# Patient Record
Sex: Male | Born: 1975 | Race: Black or African American | Hispanic: No | Marital: Single | Smoking: Current every day smoker
Health system: Southern US, Community
[De-identification: ages and names within clinical notes are randomized; demographics above are authoritative.]

---

## 2005-10-11 ENCOUNTER — Emergency Department (HOSPITAL_COMMUNITY): Admission: EM | Admit: 2005-10-11 | Discharge: 2005-10-11 | Payer: Self-pay | Admitting: Cardiology

## 2007-04-06 ENCOUNTER — Emergency Department (HOSPITAL_COMMUNITY): Admission: EM | Admit: 2007-04-06 | Discharge: 2007-04-06 | Payer: Self-pay | Admitting: Emergency Medicine

## 2016-08-05 ENCOUNTER — Emergency Department (HOSPITAL_COMMUNITY)
Admission: EM | Admit: 2016-08-05 | Discharge: 2016-08-05 | Disposition: A | Discharge status: Home / Self Care | Payer: Self-pay | Attending: Emergency Medicine | Admitting: Emergency Medicine

## 2016-08-05 ENCOUNTER — Encounter (HOSPITAL_COMMUNITY): Payer: Self-pay | Admitting: *Deleted

## 2016-08-05 DIAGNOSIS — T148XXA Other injury of unspecified body region, initial encounter: Secondary | ICD-10-CM

## 2016-08-05 DIAGNOSIS — S46911A Strain of unspecified muscle, fascia and tendon at shoulder and upper arm level, right arm, initial encounter: Secondary | ICD-10-CM | POA: Insufficient documentation

## 2016-08-05 DIAGNOSIS — Y939 Activity, unspecified: Secondary | ICD-10-CM | POA: Insufficient documentation

## 2016-08-05 DIAGNOSIS — X503XXA Overexertion from repetitive movements, initial encounter: Secondary | ICD-10-CM | POA: Insufficient documentation

## 2016-08-05 DIAGNOSIS — Y990 Civilian activity done for income or pay: Secondary | ICD-10-CM | POA: Insufficient documentation

## 2016-08-05 DIAGNOSIS — S29012A Strain of muscle and tendon of back wall of thorax, initial encounter: Secondary | ICD-10-CM | POA: Insufficient documentation

## 2016-08-05 DIAGNOSIS — F17200 Nicotine dependence, unspecified, uncomplicated: Secondary | ICD-10-CM | POA: Insufficient documentation

## 2016-08-05 DIAGNOSIS — S46912A Strain of unspecified muscle, fascia and tendon at shoulder and upper arm level, left arm, initial encounter: Secondary | ICD-10-CM | POA: Insufficient documentation

## 2016-08-05 DIAGNOSIS — J069 Acute upper respiratory infection, unspecified: Secondary | ICD-10-CM | POA: Insufficient documentation

## 2016-08-05 DIAGNOSIS — Y929 Unspecified place or not applicable: Secondary | ICD-10-CM | POA: Insufficient documentation

## 2016-08-05 LAB — RAPID STREP SCREEN (MED CTR MEBANE ONLY): STREPTOCOCCUS, GROUP A SCREEN (DIRECT): NEGATIVE

## 2016-08-05 MED ORDER — METHOCARBAMOL 500 MG PO TABS
500.0000 mg | ORAL_TABLET | Freq: Two times a day (BID) | ORAL | 0 refills | Status: DC
Start: 2016-08-05 — End: 2016-11-08

## 2016-08-05 MED ORDER — IBUPROFEN 800 MG PO TABS: 800.0000 mg | ORAL_TABLET | Freq: Three times a day (TID) | ORAL | 0 refills | Status: DC

## 2016-08-05 MED ORDER — KETOROLAC TROMETHAMINE 60 MG/2ML IM SOLN
60.0000 mg | Freq: Once | INTRAMUSCULAR | Status: AC
  Administered 2016-08-05: 60 mg via INTRAMUSCULAR
  Filled 2016-08-05: qty 2

## 2016-08-05 MED ORDER — HYDROCODONE-ACETAMINOPHEN 5-325 MG PO TABS: 1.0000 | ORAL_TABLET | Freq: Four times a day (QID) | ORAL | 0 refills | Status: DC | PRN

## 2016-08-05 MED ORDER — HYDROCODONE-ACETAMINOPHEN 5-325 MG PO TABS
2.0000 | ORAL_TABLET | Freq: Once | ORAL | Status: AC
  Administered 2016-08-05: 2 via ORAL
  Filled 2016-08-05: qty 2

## 2016-08-05 MED ORDER — METHOCARBAMOL 500 MG PO TABS
1000.0000 mg | ORAL_TABLET | Freq: Once | ORAL | Status: AC
  Administered 2016-08-05: 1000 mg via ORAL
  Filled 2016-08-05: qty 2

## 2016-08-05 MED ORDER — LIDOCAINE 5 % EX PTCH: 1.0000 | MEDICATED_PATCH | CUTANEOUS | 0 refills | Status: DC

## 2016-08-05 NOTE — ED Provider Notes (Signed)
[facility]EMER[facility]Y DEPT Provider Note   CSN: [id number] Arrival date & time: 08/05/16  1321  By signing my name below, I, Sonum Patel, attest that this documentation has been prepared under the direction and in the presence of Shawn Joy, PA-C. Electronically Signed: Sonum Patel, Neurosurgeon. 08/05/16. 2:58 PM.  History   Chief Complaint Chief Complaint  Patient presents with  . Back Pain  . URI    The history is provided by the patient. No language interpreter was used.    HPI Comments: Eric Friedman is a 41 y.o. male who presents to the Emergency Department complaining of persistent upper to mid back pain and bilateral shoulder pain that began 5 days ago. He reports associated bilateral hand swelling and paresthesia to the bilateral pinky fingers. He recently started working at a new job which involves heavy lifting and repetitive motions. He states the pain is worse with any movement of the upper body. He has tried ibuprofen intermittently without significant relief. He recently moved and has been sleeping on the floor which he believes is contributing to his symptoms.   He has secondary complaints of a scratchy throat, rhinorrhea, and HA that began last night. He denies fever, vomiting, CP, SOB, abdominal pain, cough, neuro deficits, or any other symptoms.   History reviewed. No pertinent past medical history.  There are no active problems to display for this patient.   History reviewed. No pertinent surgical history.     Home Medications    Prior to Admission medications   Medication Sig Start Date End Date Taking? Authorizing Provider  HYDROcodone-acetaminophen (NORCO/VICODIN) 5-325 MG tablet Take 1 tablet by mouth every 6 (six) hours as needed for severe pain. 08/05/16   Shawn C Joy, PA-C  ibuprofen (ADVIL,MOTRIN) 800 MG tablet Take 1 tablet (800 mg total) by mouth 3 (three) times daily. 08/05/16   Shawn C Joy, PA-C  lidocaine (LIDODERM) 5 % Place 1 patch onto the skin daily.  Remove & Discard patch within 12 hours or as directed by MD 08/05/16   Anselm Pancoast, PA-C  methocarbamol (ROBAXIN) 500 MG tablet Take 1 tablet (500 mg total) by mouth 2 (two) times daily. 08/05/16   Anselm Pancoast, PA-C    Family History No family history on file.  Social History Social History  Substance Use Topics  . Smoking status: Current Every Day Smoker    Packs/day: 0.50  . Smokeless tobacco: Never Used  . Alcohol use No     Allergies   Patient has no known allergies.   Review of Systems Review of Systems  Constitutional: Negative for fever.  HENT: Positive for rhinorrhea and sore throat.   Respiratory: Negative for shortness of breath.   Gastrointestinal: Negative for abdominal pain and vomiting.  Musculoskeletal: Positive for back pain, joint swelling and myalgias.  Neurological: Positive for headaches. Negative for weakness and numbness.       +paresthesia  All other systems reviewed and are negative.    Physical Exam Updated Vital Signs BP 101/65 (BP Location: Right Arm)   Pulse 60   Temp 98.3 F (36.8 C) (Oral)   Resp 16   Ht 6' 1.5" (1.867 m)   Wt 215 lb (97.5 kg)   SpO2 100%   BMI 27.98 kg/m   Physical Exam  Constitutional: He appears well-developed and well-nourished. No distress.  HENT:  Head: Normocephalic and atraumatic.  Nose: Nose normal.  Mouth/Throat: Oropharynx is clear and moist.  Eyes: Conjunctivae and EOM are normal. Pupils  are equal, round, and reactive to light.  Neck: Normal range of motion. Neck supple.  Cardiovascular: Normal rate, regular rhythm, normal heart sounds and intact distal pulses.   Pulmonary/Chest: Effort normal and breath sounds normal. No respiratory distress. He exhibits no tenderness.  Abdominal: Soft.  Musculoskeletal: He exhibits tenderness. He exhibits no edema.  Tenderness across bilateral trapezius, latissimus dorsi, and deltoids.  Normal motor function intact in all extremities and spine. No midline spinal  tenderness.   Lymphadenopathy:    He has no cervical adenopathy.  Neurological: He is alert. He has normal strength. No sensory deficit.  Endorses some paresthesias in the pinky fingers of both hands but able to spread and close fingers against resistance. Overall sensation intact. Strength 5/5 in all extremities. No gait disturbance. Coordination intact including heel to shin and finger to nose. Cranial nerves III-XII grossly intact.   Skin: Skin is warm and dry. Capillary refill takes less than 2 seconds. He is not diaphoretic.  Psychiatric: He has a normal mood and affect. His behavior is normal.  Nursing note and vitals reviewed.    ED Treatments / Results  DIAGNOSTIC STUDIES: Oxygen Saturation is 100% on RA, normal by my interpretation.    COORDINATION OF CARE: 2:58 PM Discussed treatment plan with pt at bedside and pt agreed to plan.    Labs (all labs ordered are listed, but only abnormal results are displayed) Labs Reviewed  RAPID STREP SCREEN (NOT AT [facility])  CULTURE, GROUP A STREP [facility])    EKG  EKG Interpretation None       Radiology No results found.  Procedures Procedures (including critical care time)  Medications Ordered in ED Medications  ketorolac (TORADOL) injection 60 mg (60 mg Intramuscular Given 08/05/16 1527)  methocarbamol (ROBAXIN) tablet 1,000 mg (1,000 mg Oral Given 08/05/16 1526)  HYDROcodone-acetaminophen (NORCO/VICODIN) 5-325 MG per tablet 2 tablet (2 tablets Oral Given 08/05/16 1526)     Initial Impression / Assessment and Plan / ED Course  I have reviewed the triage vital signs and the nursing notes.  Pertinent labs & imaging results that were available during my care of the patient were reviewed by me and considered in my medical decision making (see chart for details).  Clinical Course     Suspect muscular strain of the large muscles of the upper back and shoulders. Low suspicion for rotator cuff or other ligamentous injury. Patients  other symptoms are consistent with an URI, likely viral. Symptomatic care and return precautions were discussed for both complaints. Patient voiced understanding of all instructions and is comfortable with discharge.   Final Clinical Impressions(s) / ED Diagnoses   Final diagnoses:  Muscle strain  Upper respiratory tract infection, unspecified type    New Prescriptions Discharge Medication List as of 08/05/2016  3:11 PM    START taking these medications   Details  HYDROcodone-acetaminophen (NORCO/VICODIN) 5-325 MG tablet Take 1 tablet by mouth every 6 (six) hours as needed for severe pain., Starting Sat 08/05/2016, Print    ibuprofen (ADVIL,MOTRIN) 800 MG tablet Take 1 tablet (800 mg total) by mouth 3 (three) times daily., Starting Sat 08/05/2016, Print    lidocaine (LIDODERM) 5 % Place 1 patch onto the skin daily. Remove & Discard patch within 12 hours or as directed by MD, Starting Sat 08/05/2016, Print    methocarbamol (ROBAXIN) 500 MG tablet Take 1 tablet (500 mg total) by mouth 2 (two) times daily., Starting Sat 08/05/2016, Print       I personally performed  the services described in this documentation, which was scribed in my presence. The recorded information has been reviewed and is accurate.   Anselm Pancoast, PA-C 08/05/16 1820    Anselm Pancoast, PA-C 08/05/16 1820    Nira Conn, MD 08/06/16 [phone]

## 2016-08-05 NOTE — Discharge Instructions (Signed)
Shoulder and back pain: Take it easy, but do not lay around too much as this may make any stiffness worse. Take 500 mg of naproxen every 12 hours or 800 mg of ibuprofen every 8 hours for the next 3 days. Take these medications with food to avoid upset stomach. Robaxin is a muscle relaxer and may help loosen stiff muscles. Vicodin for severe pain. Do not take the Robaxin or Vicodin while driving or performing other dangerous activities. Be sure to perform the attached exercises starting with three times a week and working up to performing them daily. This is an essential part of preventing long term problems. Follow up with a primary care provider for any future management of these complaints. Note: there are over the counter version of the lidocaine patches that you may use instead of the prescribed version and both work well. An example of these is Solonpas.  Sore throat and other symptoms: Your symptoms are consistent with a viral illness. Viruses do not require antibiotics. Treatment is symptomatic care and it is important to note that these symptoms may last for 7-10 days. Drink plenty of fluids and get plenty of rest. You should be drinking at least half a liter of water an hour to stay hydrated. Electrolyte drinks are also encouraged. Ibuprofen, Naproxen, or Tylenol for pain or fever. Plain Mucinex may help relieve congestion. Warm liquids or Chloraseptic spray may help soothe a sore throat. Follow up with a primary care provider, as needed, for any future management of this issue.

## 2016-08-05 NOTE — ED Triage Notes (Addendum)
Pt states lower back pain and pain in shoulder blades when he raises shoulders (since Mon).  Today when he was checking in he noticed both his hands were swollen.  Today when he woke up he had a sore throat and congestion.  He lifts furniture for a living.

## 2016-08-08 LAB — CULTURE, GROUP A STREP (THRC)

## 2016-09-24 ENCOUNTER — Emergency Department (HOSPITAL_COMMUNITY)
Admission: EM | Admit: 2016-09-24 | Discharge: 2016-09-24 | Disposition: A | Discharge status: Home / Self Care | Payer: Self-pay | Attending: Emergency Medicine | Admitting: Emergency Medicine

## 2016-09-24 ENCOUNTER — Encounter (HOSPITAL_COMMUNITY): Payer: Self-pay | Admitting: Emergency Medicine

## 2016-09-24 DIAGNOSIS — Z8619 Personal history of other infectious and parasitic diseases: Secondary | ICD-10-CM

## 2016-09-24 DIAGNOSIS — R768 Other specified abnormal immunological findings in serum: Secondary | ICD-10-CM | POA: Insufficient documentation

## 2016-09-24 DIAGNOSIS — Z79899 Other long term (current) drug therapy: Secondary | ICD-10-CM | POA: Insufficient documentation

## 2016-09-24 DIAGNOSIS — Z202 Contact with and (suspected) exposure to infections with a predominantly sexual mode of transmission: Secondary | ICD-10-CM | POA: Insufficient documentation

## 2016-09-24 DIAGNOSIS — F17200 Nicotine dependence, unspecified, uncomplicated: Secondary | ICD-10-CM | POA: Insufficient documentation

## 2016-09-24 NOTE — ED Triage Notes (Signed)
Pt. Stated, I was trying to give plasma and they gave me a piece of paper stated I have syphllis.  I need to be treated.

## 2016-09-24 NOTE — Discharge Instructions (Signed)
It was our pleasure to provide your ER care today.  We sent syphilis tests today, the results should be back in 1-2 days time - we should notify you if positive.  To ensure that you get your results, call back in 2 days time to get your results.  If those results are positive, return for treatment.   Also follow up with [facility] if any additional concerns.

## 2016-09-24 NOTE — ED Notes (Signed)
Declined W/C at D/C and was escorted to lobby by RN. 

## 2016-09-24 NOTE — ED Provider Notes (Signed)
[facility] Provider Note   CSN: [id number] Arrival date & time: 09/24/16  1456  By signing my name below, I, Linna Darner, attest that this documentation has been prepared under the direction and in the presence of physician practitioner, Cathren Laine, MD. Electronically Signed: Linna Darner, Scribe. 09/24/2016. 3:52 PM.  History   Chief Complaint Chief Complaint  Patient presents with  . Exposure to STD    The history is provided by the patient. No language interpreter was used.     HPI Comments: Eric Friedman is a 41 y.o. male who presents to the Emergency Department with concern for syphilis. He states he attempted to donate blood plasma today and was told he may have syphilis. 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 Pt denies h/o other STD's. He states he has had one sexual partner over the last several years and has experienced a single bump on base of penis one time many months ago.  He states he was treated then, and has not had sexual lesions with that partner or new partner in the last 2 months.  He notes his sexual partner has told him multiple times that she does not have STD's. He denies abdominal pain, penile discharge, genital sores, or any other associated symptoms.  No recent genital ulcer/lesion. No hx any rash. States feels well.   History reviewed. No pertinent past medical history.  There are no active problems to display for this patient.   History reviewed. No pertinent surgical history.     Home Medications    Prior to Admission medications   Medication Sig Start Date End Date Taking? Authorizing Provider  HYDROcodone-acetaminophen (NORCO/VICODIN) 5-325 MG tablet Take 1 tablet by mouth every 6 (six) hours as needed for severe pain. 08/05/16   Shawn C Joy, PA-C  ibuprofen (ADVIL,MOTRIN) 800 MG tablet Take 1 tablet (800 mg total) by mouth 3 (three) times daily. 08/05/16   Shawn C Joy, PA-C   lidocaine (LIDODERM) 5 % Place 1 patch onto the skin daily. Remove & Discard patch within 12 hours or as directed by MD 08/05/16   Anselm Pancoast, PA-C  methocarbamol (ROBAXIN) 500 MG tablet Take 1 tablet (500 mg total) by mouth 2 (two) times daily. 08/05/16   Anselm Pancoast, PA-C    Family History No family history on file.  Social History Social History  Substance Use Topics  . Smoking status: Current Every Day Smoker    Packs/day: 0.50  . Smokeless tobacco: Never Used  . Alcohol use No     Allergies   Patient has no known allergies.   Review of Systems Review of Systems  Constitutional: Negative for fever.  Gastrointestinal: Negative for abdominal pain.  Genitourinary: Negative for discharge and genital sores.  Skin: Negative for rash.  All other systems reviewed and are negative.    Physical Exam Updated Vital Signs BP 123/75 (BP Location: Left Arm)   Pulse 72   Temp 97.9 F (36.6 C) (Oral)   Resp 16   Ht 6' 1.5" (1.867 m)   Wt 207 lb (93.9 kg)   SpO2 100%   BMI 26.94 kg/m   Physical Exam  Constitutional: He is oriented to person, place, and time. He appears well-developed and well-nourished. No distress.  HENT:  Head: Normocephalic and atraumatic.  Eyes: Conjunctivae and EOM are normal.  Neck: Neck supple. No tracheal deviation present.  Cardiovascular: Normal rate.   Pulmonary/Chest:  Effort normal. No respiratory distress.  Genitourinary: Testes normal and penis normal.  Genitourinary Comments: Normal external gu exam, no lesions seen. No penile d/c.    Musculoskeletal: Normal range of motion.  Neurological: He is alert and oriented to person, place, and time.  Skin: Skin is warm and dry. No rash noted.  Psychiatric: He has a normal mood and affect. His behavior is normal.  Nursing note and vitals reviewed.    ED Treatments / Results  Labs (all labs ordered are listed, but only abnormal results are displayed) Labs Reviewed - No data to display  EKG   EKG Interpretation None       Radiology No results found.  Procedures Procedures (including critical care time)  DIAGNOSTIC STUDIES: Oxygen Saturation is 100% on RA, normal by my interpretation.    COORDINATION OF CARE: 3:56 PM Discussed treatment plan with pt at bedside and pt agreed to plan.  Medications Ordered in ED Medications - No data to display   Initial Impression / Assessment and Plan / ED Course  I have reviewed the triage vital signs and the nursing notes.  Pertinent labs & imaging results that were available during my care of the patient were reviewed by me and considered in my medical decision making (see chart for details).  ?whether possible false pos result vs asymptomatic syp infection - pt denies penile ulcer/lesion. No hx rash.   Will order testing today.     Final Clinical Impressions(s) / ED Diagnoses   Final diagnoses:  None    New Prescriptions New Prescriptions   No medications on file   I personally performed the services described in this documentation, which was scribed in my presence. The recorded information has been reviewed and considered. Cathren LaineKevin Edvardo Honse, MD    Cathren LaineKevin Claretta Kendra, MD 09/24/16 [phone]

## 2016-09-25 LAB — RPR, QUANT+TP ABS (REFLEX): T Pallidum Abs: POSITIVE — AB

## 2016-09-25 LAB — FLUORESCENT TREPONEMAL AB(FTA)-IGG-BLD: FLUORESCENT TREPONEMAL AB, IGG: REACTIVE — AB

## 2016-09-25 LAB — RPR: RPR: REACTIVE — AB

## 2016-11-07 ENCOUNTER — Encounter (HOSPITAL_COMMUNITY): Payer: Self-pay

## 2016-11-07 ENCOUNTER — Emergency Department (HOSPITAL_COMMUNITY): Payer: No Typology Code available for payment source

## 2016-11-07 ENCOUNTER — Emergency Department (HOSPITAL_COMMUNITY)
Admission: EM | Admit: 2016-11-07 | Discharge: 2016-11-08 | Disposition: A | Discharge status: Home / Self Care | Payer: No Typology Code available for payment source | Attending: Emergency Medicine | Admitting: Emergency Medicine

## 2016-11-07 DIAGNOSIS — Y939 Activity, unspecified: Secondary | ICD-10-CM | POA: Insufficient documentation

## 2016-11-07 DIAGNOSIS — S3992XA Unspecified injury of lower back, initial encounter: Secondary | ICD-10-CM | POA: Diagnosis present

## 2016-11-07 DIAGNOSIS — S239XXA Sprain of unspecified parts of thorax, initial encounter: Secondary | ICD-10-CM

## 2016-11-07 DIAGNOSIS — S39012A Strain of muscle, fascia and tendon of lower back, initial encounter: Secondary | ICD-10-CM | POA: Insufficient documentation

## 2016-11-07 DIAGNOSIS — Y999 Unspecified external cause status: Secondary | ICD-10-CM | POA: Diagnosis not present

## 2016-11-07 DIAGNOSIS — Y92410 Unspecified street and highway as the place of occurrence of the external cause: Secondary | ICD-10-CM | POA: Diagnosis not present

## 2016-11-07 DIAGNOSIS — F17200 Nicotine dependence, unspecified, uncomplicated: Secondary | ICD-10-CM | POA: Diagnosis not present

## 2016-11-07 MED ORDER — OXYCODONE-ACETAMINOPHEN 5-325 MG PO TABS
1.0000 | ORAL_TABLET | Freq: Once | ORAL | Status: AC
  Administered 2016-11-07: 1 via ORAL
  Filled 2016-11-07: qty 1

## 2016-11-07 MED ORDER — METHOCARBAMOL 500 MG PO TABS
750.0000 mg | ORAL_TABLET | Freq: Once | ORAL | Status: AC
  Administered 2016-11-07: 750 mg via ORAL
  Filled 2016-11-07: qty 2
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
## 2016-11-08 MED ORDER — OXYCODONE-ACETAMINOPHEN 5-325 MG PO TABS: 1.0000 | ORAL_TABLET | Freq: Four times a day (QID) | ORAL | 0 refills | Status: DC | PRN

## 2016-11-08 MED ORDER — METHOCARBAMOL 500 MG PO TABS: 500.0000 mg | ORAL_TABLET | Freq: Two times a day (BID) | ORAL | 0 refills | Status: DC
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
## 2016-11-08 NOTE — Discharge Instructions (Signed)
Take the medication as directed for the next several days, avoid heavy lifting or sudden movements.  U been given a series of back exercises that you can start doing in several days.  You've also been given a referral to orthopedic surgery if needed.  As discussed, you have this small area at the end of one of the vertebrae.  It appears to be old.

## 2016-11-17 ENCOUNTER — Encounter (HOSPITAL_COMMUNITY): Payer: Self-pay | Admitting: *Deleted

## 2016-11-17 ENCOUNTER — Emergency Department (HOSPITAL_COMMUNITY)
Admission: EM | Admit: 2016-11-17 | Discharge: 2016-11-17 | Disposition: A | Discharge status: Home / Self Care | Payer: Self-pay | Attending: Emergency Medicine | Admitting: Emergency Medicine

## 2016-11-17 DIAGNOSIS — L03116 Cellulitis of left lower limb: Secondary | ICD-10-CM | POA: Insufficient documentation

## 2016-11-17 DIAGNOSIS — T148XXA Other injury of unspecified body region, initial encounter: Secondary | ICD-10-CM

## 2016-11-17 DIAGNOSIS — Z79899 Other long term (current) drug therapy: Secondary | ICD-10-CM | POA: Insufficient documentation

## 2016-11-17 DIAGNOSIS — L089 Local infection of the skin and subcutaneous tissue, unspecified: Secondary | ICD-10-CM

## 2016-11-17 DIAGNOSIS — F17210 Nicotine dependence, cigarettes, uncomplicated: Secondary | ICD-10-CM | POA: Insufficient documentation

## 2016-11-17 MED ORDER — SULFAMETHOXAZOLE-TRIMETHOPRIM 800-160 MG PO TABS: 1.0000 | ORAL_TABLET | Freq: Two times a day (BID) | ORAL | 0 refills | Status: AC

## 2016-11-17 MED ORDER — SULFAMETHOXAZOLE-TRIMETHOPRIM 800-160 MG PO TABS
1.0000 | ORAL_TABLET | Freq: Once | ORAL | Status: AC
  Administered 2016-11-17: 1 via ORAL
  Filled 2016-11-17: qty 1

## 2016-11-17 NOTE — ED Provider Notes (Signed)
[facility]-EMERGENCY DEPT Provider Note   CSN: [id number] Arrival date & time: 11/17/16  1743     History   Chief Complaint Chief Complaint  Patient presents with  . Wound Infection    HPI Eric Friedman is a 41 y.o. male.  The history is provided by the patient.   CC: wound  Onset/Duration: 2 weeks Timing: constant; worsening on left foot; improving on right foot Location: dorsum of bilateral feet Severity: moderate Modifying Factors:  Improved by: nothing  Worsened by: nothing Associated Signs/Symptoms:  Pertinent (+): left foot swelling and mild pain  Pertinent (-): fever, chills, n/v/d Context: no prior h/o of staph or MRSA infection. No known injuries. No bites.  History reviewed. No pertinent past medical history.  There are no active problems to display for this patient.   History reviewed. No pertinent surgical history.     Home Medications    Prior to Admission medications   Medication Sig Start Date End Date Taking? Authorizing Provider  HYDROcodone-acetaminophen (NORCO/VICODIN) 5-325 MG tablet Take 1 tablet by mouth every 6 (six) hours as needed for severe pain. 08/05/16   Shawn C Joy, PA-C  ibuprofen (ADVIL,MOTRIN) 800 MG tablet Take 1 tablet (800 mg total) by mouth 3 (three) times daily. 08/05/16   Shawn C Joy, PA-C  lidocaine (LIDODERM) 5 % Place 1 patch onto the skin daily. Remove & Discard patch within 12 hours or as directed by MD 08/05/16   Anselm Pancoast, PA-C  methocarbamol (ROBAXIN) 500 MG tablet Take 1 tablet (500 mg total) by mouth 2 (two) times daily. 11/08/16   Earley Favor, NP  oxyCODONE-acetaminophen (PERCOCET/ROXICET) 5-325 MG tablet Take 1 tablet by mouth every 6 (six) hours as needed for severe pain. 11/08/16   Earley Favor, NP  sulfamethoxazole-trimethoprim (BACTRIM DS,SEPTRA DS) 800-160 MG tablet Take 1 tablet by mouth 2 (two) times daily. 11/17/16 11/24/16  Nira Conn, MD    Family History No family history on file.  Social  History Social History  Substance Use Topics  . Smoking status: Current Every Day Smoker    Packs/day: 0.50  . Smokeless tobacco: Never Used  . Alcohol use No     Allergies   Patient has no known allergies.   Review of Systems Review of Systems All other systems are reviewed and are negative for acute change except as noted in the HPI   Physical Exam Updated Vital Signs BP 120/79   Pulse 79   Temp 99.1 F (37.3 C) (Oral)   Resp 18   SpO2 100%   Physical Exam  Constitutional: He is oriented to person, place, and time. He appears well-developed and well-nourished. No distress.  HENT:  Head: Normocephalic and atraumatic.  Right Ear: External ear normal.  Left Ear: External ear normal.  Nose: Nose normal.  Mouth/Throat: Mucous membranes are normal. No trismus in the jaw.  Eyes: Conjunctivae and EOM are normal. No scleral icterus.  Neck: Normal range of motion and phonation normal.  Cardiovascular: Normal rate and regular rhythm.   Pulmonary/Chest: Effort normal. No stridor. No respiratory distress.  Abdominal: He exhibits no distension.  Musculoskeletal: Normal range of motion. He exhibits no edema.  Neurological: He is alert and oriented to person, place, and time.  Skin: He is not diaphoretic.  Wounds to the dorsum of bilateral feet left worse than right with associated swelling and tenderness to palpation of the left foot. see images  Psychiatric: He has a normal mood and affect. His behavior is  normal.  Vitals reviewed.        ED Treatments / Results  Labs (all labs ordered are listed, but only abnormal results are displayed) Labs Reviewed  AEROBIC CULTURE (SUPERFICIAL SPECIMEN)    EKG  EKG Interpretation None       Radiology No results found.  Procedures Procedures (including critical care time)  Medications Ordered in ED Medications  sulfamethoxazole-trimethoprim (BACTRIM DS,SEPTRA DS) 800-160 MG per tablet 1 tablet (1 tablet Oral Given  11/17/16 2226)     Initial Impression / Assessment and Plan / ED Course  I have reviewed the triage vital signs and the nursing notes.  Pertinent labs & imaging results that were available during my care of the patient were reviewed by me and considered in my medical decision making (see chart for details).     Contact irritation with likely superimposed infection on left foot; possible staph. Will treat to with Bactrim.  The patient is safe for discharge with strict return precautions.   Final Clinical Impressions(s) / ED Diagnoses   Final diagnoses:  Wound infection  Cellulitis of left lower extremity   Disposition: Discharge  Condition: Good  I have discussed the results, Dx and Tx plan with the patient who expressed understanding and agree(s) with the plan. Discharge instructions discussed at great length. The patient was given strict return precautions who verbalized understanding of the instructions. No further questions at time of discharge.    New Prescriptions   SULFAMETHOXAZOLE-TRIMETHOPRIM (BACTRIM DS,SEPTRA DS) 800-160 MG TABLET    Take 1 tablet by mouth 2 (two) times daily.    Follow Up: primary care provider  Call  For help establishing care with a care provider      Nira Conn, MD 11/17/16 2242

## 2016-11-17 NOTE — ED Triage Notes (Addendum)
Pt reports noticing a wound to tops of bilateral feet. Pt states that they have continued to worsen. Pt noted to have swelling to feet and drainage to wounds. 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 Denies hx of diabetes

## 2016-11-20 LAB — AEROBIC CULTURE W GRAM STAIN (SUPERFICIAL SPECIMEN)

## 2016-11-20 LAB — AEROBIC CULTURE  (SUPERFICIAL SPECIMEN)

## 2016-11-21 ENCOUNTER — Telehealth: Payer: Self-pay | Admitting: *Deleted

## 2016-11-21 NOTE — Telephone Encounter (Signed)
Post ED Visit - Positive Culture Follow-up: Unsuccessful Patient Follow-up  Culture assessed and recommendations reviewed by:   Enzo Bi, Pharm.D.  Celedonio Miyamoto, Pharm.D., BCPS AQ-ID  Garvin Fila, Pharm.D., BCPS  Georgina Pillion, [address].D., BCPS  [place], [address].D., BCPS, AAHIVP  Estella Husk, Pharm.D., BCPS, AAHIVP  Lysle Pearl, PharmD, BCPS  Casilda Carls, PharmD, BCPS  Pollyann Samples, PharmD, BCPS Graciella Freer, PA-C  Positive wound culture   Patient discharged without antimicrobial prescription and treatment is now indicated  Organism is resistant to prescribed ED discharge antimicrobial  Patient with positive blood cultures   Unable to contact patient after 3 attempts, letter will be sent to address on file  Lysle Pearl 11/21/2016, 10:46 AM

## 2016-11-21 NOTE — Telephone Encounter (Signed)
Post ED Visit - Positive Culture Follow-up: Successful Patient Follow-Up  Culture assessed and recommendations reviewed by:  Enzo Bi, Pharm.D.  Celedonio Miyamoto, Pharm.D., BCPS AQ-ID  Garvin Fila, Pharm.D., BCPS  Georgina Pillion, Pharm.D., BCPS  [place], [place].D., BCPS, AAHIVP  Estella Husk, Pharm.D., BCPS, AAHIVP  Lysle Pearl, PharmD, BCPS  Casilda Carls, PharmD, BCPS  Pollyann Samples, PharmD, BCPS  Positive woulnd culture   Patient discharged without antimicrobial prescription and treatment is now indicated  Organism is resistant to prescribed ED discharge antimicrobial  Patient with positive blood cultures  Changes discussed with ED provider Graciella Freer, PA-C New antibiotic prescription Cephalexin  PO QID x 5 days Called to [facility] [address]  Contacted patient, date 11/21/2016, time 1130   Lysle Pearl 11/21/2016, 11:30 AM

## 2016-11-21 NOTE — Progress Notes (Signed)
ED Antimicrobial Stewardship Positive Culture Follow Up   Eric Friedman is an 42 y.o. male who presented to [facility] on 11/17/2016 with a chief complaint of  Chief Complaint  Patient presents with  . Wound Infection    Recent Results (from the past 720 hour(s))  Wound or Superficial Culture     Status: None   Collection Time: 11/17/16 10:25 PM  Result Value Ref Range Status   Specimen Description WOUND  Final   Special Requests LEFT FOOT  Final   Gram Stain   Final    FEW WBC PRESENT, PREDOMINANTLY PMN MODERATE GRAM POSITIVE COCCI IN PAIRS    Culture ABUNDANT GROUP A STREP (S.PYOGENES) ISOLATED  Final   Report Status 11/20/2016 FINAL  Final     Treated with Bactrim, organism resistant to prescribed antimicrobial   New antibiotic prescription: Start cephalexin 500 mg PO QID x 5 days. Stop Bactrim.   ED Provider: Graciella Freer, PA-C   Oda Cogan, PharmD Candidate 11/21/2016, 9:51 AM

## 2016-11-30 ENCOUNTER — Telehealth: Payer: Self-pay | Admitting: Emergency Medicine

## 2016-11-30 NOTE — Telephone Encounter (Signed)
Lost to followup, no forward address

## 2017-01-29 ENCOUNTER — Encounter (HOSPITAL_COMMUNITY): Payer: Self-pay

## 2017-01-29 ENCOUNTER — Emergency Department (HOSPITAL_COMMUNITY)
Admission: EM | Admit: 2017-01-29 | Discharge: 2017-01-29 | Disposition: A | Discharge status: Home / Self Care | Payer: Self-pay | Attending: Emergency Medicine | Admitting: Emergency Medicine

## 2017-01-29 DIAGNOSIS — F17200 Nicotine dependence, unspecified, uncomplicated: Secondary | ICD-10-CM | POA: Insufficient documentation

## 2017-01-29 DIAGNOSIS — L03116 Cellulitis of left lower limb: Secondary | ICD-10-CM

## 2017-01-29 MED ORDER — CEPHALEXIN 500 MG PO CAPS: 500.0000 mg | ORAL_CAPSULE | Freq: Four times a day (QID) | ORAL | 0 refills | Status: AC

## 2017-01-29 NOTE — ED Provider Notes (Signed)
[facility] Provider Note  By signing my name below, I, Sonum Patel, attest that this documentation has been prepared under the direction and in the presence of Wille Glaserobyn Wokeck, NP. Electronically Signed: Sonum Patel, Neurosurgeoncribe. 01/29/17. 11:41 AM.  ED Clinical Impression   Cellulitis of left lower extremity   History   Chief Complaint Medication Refill   HPI  Patient is a 41 y.o. male presenting to the ED requesting a medication refill today. He was seen on 11/17/16 for a cellulitis and was started on Bactrim, then subsequently switched to Keflex with relief in symptoms. He reports improvement to the original wounds and cellulitis with Keflex, but states that he has recently noticed redness and rash to left foot approx 3 weeks ago, states looks similar to previous cellulitis, requesting refill of Keflex. No bleeding or drainage from site. Denies recent injury or known trauma to area. Denies recent insect bites or hx of MRSA. Denies fevers, chills, unexplained weight loss, dizziness, vision or gait changes, CP, SOB, cough, abd pain, n/v/d, extremity numbness or tingling, extremity weakness, or any additional concerns. No hx of immunosuppression.   History reviewed. No pertinent past medical history.  History reviewed. No pertinent surgical history.  Current Outpatient Rx  . Order #: [id number] Class: Print  . Order #: [id number] Class: Print  . Order #: [id number] Class: Print  . Order #: [id number] Class: Print  . Order #: [id number] Class: Print    Allergies Patient has no known allergies.  No family history on file.  Social History Social History  Substance Use Topics  . Smoking status: Current Every Day Smoker    Packs/day: 0.50  . Smokeless tobacco: Never Used  . Alcohol use No    Review of Systems  Constitutional: Negative for fever, chills, or unexplained weight loss. Eyes: Negative for visual changes. Cardiovascular: Negative for chest pain or extremity  swelling. Respiratory: Negative for shortness of breath or cough. Gastrointestinal: Negative for abdominal pain, nausea, vomiting, or diarrhea. Genitourinary: Negative for dysuria. Musculoskeletal: Negative for back pain or extremity pain/swelling. Skin:+rash Neurological: Negative for headaches, dizziness, focal weakness, or numbness/tingling.  Physical Exam   VITAL SIGNS:   ED Triage Vitals [01/29/17 0937]  Enc Vitals Group     BP 126/75     Pulse Rate (!) 58     Resp 15     Temp 97.5 F (36.4 C)     Temp Source Oral     SpO2 100 %     Weight 205 lb (93 kg)     Height 6' 1.5" (1.867 m)     Head Circumference      Peak Flow      Pain Score 0     Pain Loc      Pain Edu?      Excl. in GC?      Constitutional: Alert and oriented. Well appearing and in no respiratory apparent distress. Eyes: PERRL, EOMI, Conjunctivae normal ENT      Head: Normocephalic and atraumatic.      Mouth/Throat: Mucous membranes are moist. Oropharynx without erythema or exudate. No oral lesions noted. Normal voice, handling secretions normally.      Neck: Supple, no nuchal signs, full active ROM of neck. Cardiovascular: Normal S1 S2, regular rhythm, normal rate. Normal and symmetric distal pulses are present in all extremities. Respiratory: Breath sounds clear and equal bilaterally. No wheezes, rales, or rhonchi. Normal respiratory effort.  Gastrointestinal: Abdomen soft and nontender. No rebound or guarding.  Musculoskeletal: Nontender with normal  range of motion in all extremities. Full active ROM of L ankle and foot.       Right lower leg: No tenderness or edema. Dorsalis pedis and posterior tibial pulses 2+, cap refill < 2sec.       Left lower leg: No tenderness or edema. Dorsalis pedis and posterior tibial pulses 2+, cap refill < 2sec.  Neurologic: Speech clear. Alert and appropriate, no gross focal neurologic deficits are appreciated. Gait steady with ambulation. Equal strength in all four  extremities. Extremities neurovascularly intact.  Skin: +2cmx2cm area of localized erythema to L dorsal foot, no bleeding or drainage, no fluctuance or induration. No pruritis or excoriation. No central clearing.  Psychiatric: Mood and affect are normal. Speech and behavior are normal.    ED Course, Assessment and Plan   Pt is a 41 y/o M, afebrile, who presents to ED for rash to left foot, concern for cellulitis. Low suspicion for DVT. No calf tenderness to palpation, negative Homans sign, no palpable cords. Full active ROM without ttp to L ankle and foot, doubt acute fracture. No evidence of neurovascular compromise. No petechia or purpura, no involvement of mucous membranes, no involvement of palms or soles of feet. Doubt Gwynneth Albright Syndrome, Toxic Epidermal Necrolysis, staph scalded skin syndrome, RMSF, syphilis, Lyme Disease, subcutaneous abscess, or necrotizing soft tissue infection. Will plan to discharge with keflex and PCP follow up; pt amenable to this plan.   11:48 AM Discussed results, discharge instructions, rx and safety, return precautions, and follow up. Pt verbalizes understanding using verbal teachback and agrees with plan, denies any additional concerns.   Previous chart, nursing notes, and vital signs reviewed.    Pertinent labs & imaging results that were available during my care of the patient were reviewed by me and considered in my medical decision making (see chart for details).  I personally performed the services described in this documentation, which was scribed in my presence. The recorded information has been reviewed and is accurate.    Grant Henkes, Hinton Dyer, NP 01/31/17 1118    Linwood Dibbles, MD 01/31/17 [phone]

## 2017-01-29 NOTE — ED Triage Notes (Signed)
Per Pt, Pt has a staph infection in his foot and has been prescribed Cephalexin. Pt reports needing a refill.

## 2017-01-29 NOTE — Discharge Instructions (Signed)
Please take keflex (antibiotic) as prescribed for full course. Please keep area clean and dry. Call to schedule a follow up appointment with your primary care doctor in 5-7 days regarding today's ER visit. Return to the ER if you experience fevers, chills, unexplained weight loss, dizziness, vision or gait changes, chest pain, shortness of breath, abdominal pain, nausea, vomiting, diarrhea, increased redness/warmth to area, experience swelling, bleeding or drainage from area, pain, red streaking up leg, calf pain/redness/swelling, worsening symptoms, or any additional concerns.

## 2017-06-20 ENCOUNTER — Encounter (INDEPENDENT_AMBULATORY_CARE_PROVIDER_SITE_OTHER): Payer: Self-pay | Admitting: Orthopedic Surgery

## 2017-06-20 ENCOUNTER — Ambulatory Visit (INDEPENDENT_AMBULATORY_CARE_PROVIDER_SITE_OTHER): Payer: Medicaid Other

## 2017-06-20 ENCOUNTER — Ambulatory Visit (INDEPENDENT_AMBULATORY_CARE_PROVIDER_SITE_OTHER): Payer: Medicaid Other | Admitting: Orthopedic Surgery

## 2017-06-20 DIAGNOSIS — G8929 Other chronic pain: Secondary | ICD-10-CM

## 2017-06-20 DIAGNOSIS — M25511 Pain in right shoulder: Secondary | ICD-10-CM

## 2017-06-20 DIAGNOSIS — M5412 Radiculopathy, cervical region: Secondary | ICD-10-CM
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
## 2017-07-17 ENCOUNTER — Ambulatory Visit
Admission: RE | Admit: 2017-07-17 | Discharge: 2017-07-17 | Disposition: A | Discharge status: Home / Self Care | Payer: Medicaid Other | Source: Ambulatory Visit | Attending: Orthopedic Surgery | Admitting: Orthopedic Surgery

## 2017-07-17 DIAGNOSIS — G8929 Other chronic pain: Secondary | ICD-10-CM

## 2017-07-17 DIAGNOSIS — M25511 Pain in right shoulder: Principal | ICD-10-CM

## 2017-07-17 MED ORDER — IOPAMIDOL (ISOVUE-M 200) INJECTION 41%
15.0000 mL | Freq: Once | INTRAMUSCULAR | Status: AC
  Administered 2017-07-17: 15 mL via INTRA_ARTICULAR

## 2017-08-08 ENCOUNTER — Ambulatory Visit (INDEPENDENT_AMBULATORY_CARE_PROVIDER_SITE_OTHER): Payer: Medicaid Other | Admitting: Orthopedic Surgery

## 2017-08-08 ENCOUNTER — Encounter (INDEPENDENT_AMBULATORY_CARE_PROVIDER_SITE_OTHER): Payer: Self-pay | Admitting: Orthopedic Surgery

## 2017-08-08 DIAGNOSIS — M19011 Primary osteoarthritis, right shoulder: Secondary | ICD-10-CM

## 2017-08-10 ENCOUNTER — Encounter (INDEPENDENT_AMBULATORY_CARE_PROVIDER_SITE_OTHER): Payer: Self-pay | Admitting: Orthopedic Surgery

## 2017-08-10 DIAGNOSIS — M19011 Primary osteoarthritis, right shoulder: Secondary | ICD-10-CM

## 2017-08-10 MED ORDER — LIDOCAINE HCL 1 % IJ SOLN
3.0000 mL | INTRAMUSCULAR | Status: AC | PRN
  Administered 2017-08-10: 3 mL

## 2017-08-10 MED ORDER — METHYLPREDNISOLONE ACETATE 40 MG/ML IJ SUSP
13.3300 mg | INTRAMUSCULAR | Status: AC | PRN
  Administered 2017-08-10: 13.33 mg via INTRA_ARTICULAR

## 2017-08-10 MED ORDER — BUPIVACAINE HCL 0.25 % IJ SOLN
0.6600 mL | INTRAMUSCULAR | Status: AC | PRN
  Administered 2017-08-10: .66 mL via INTRA_ARTICULAR
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
## 2017-11-15 ENCOUNTER — Ambulatory Visit: Payer: Medicaid Other | Admitting: Family Medicine

## 2018-02-22 ENCOUNTER — Other Ambulatory Visit: Payer: Self-pay

## 2018-02-22 ENCOUNTER — Emergency Department (HOSPITAL_COMMUNITY)
Admission: EM | Admit: 2018-02-22 | Discharge: 2018-02-22 | Disposition: A | Discharge status: Home / Self Care | Payer: Medicaid Other | Attending: Emergency Medicine | Admitting: Emergency Medicine

## 2018-02-22 ENCOUNTER — Encounter (HOSPITAL_COMMUNITY): Payer: Self-pay | Admitting: Emergency Medicine

## 2018-02-22 ENCOUNTER — Emergency Department (HOSPITAL_COMMUNITY): Payer: Medicaid Other

## 2018-02-22 DIAGNOSIS — F17200 Nicotine dependence, unspecified, uncomplicated: Secondary | ICD-10-CM | POA: Insufficient documentation

## 2018-02-22 DIAGNOSIS — Y999 Unspecified external cause status: Secondary | ICD-10-CM | POA: Diagnosis not present

## 2018-02-22 DIAGNOSIS — R1031 Right lower quadrant pain: Secondary | ICD-10-CM | POA: Diagnosis present

## 2018-02-22 DIAGNOSIS — S29012A Strain of muscle and tendon of back wall of thorax, initial encounter: Secondary | ICD-10-CM | POA: Diagnosis not present

## 2018-02-22 DIAGNOSIS — Y939 Activity, unspecified: Secondary | ICD-10-CM | POA: Diagnosis not present

## 2018-02-22 DIAGNOSIS — Y929 Unspecified place or not applicable: Secondary | ICD-10-CM | POA: Diagnosis not present

## 2018-02-22 DIAGNOSIS — X58XXXA Exposure to other specified factors, initial encounter: Secondary | ICD-10-CM | POA: Insufficient documentation

## 2018-02-22 LAB — COMPREHENSIVE METABOLIC PANEL
ALK PHOS: 53 U/L (ref 38–126)
ALT: 23 U/L (ref 0–44)
AST: 21 U/L (ref 15–41)
Albumin: 3.7 g/dL (ref 3.5–5.0)
Anion gap: 6 (ref 5–15)
BUN: 10 mg/dL (ref 6–20)
CALCIUM: 9 mg/dL (ref 8.9–10.3)
CHLORIDE: 108 mmol/L (ref 98–111)
CO2: 26 mmol/L (ref 22–32)
Creatinine, Ser: 1.22 mg/dL (ref 0.61–1.24)
Glucose, Bld: 85 mg/dL (ref 70–99)
Potassium: 4 mmol/L (ref 3.5–5.1)
SODIUM: 140 mmol/L (ref 135–145)
Total Bilirubin: 0.7 mg/dL (ref 0.3–1.2)
Total Protein: 7 g/dL (ref 6.5–8.1)

## 2018-02-22 LAB — URINALYSIS, ROUTINE W REFLEX MICROSCOPIC
Bilirubin Urine: NEGATIVE
GLUCOSE, UA: NEGATIVE mg/dL
HGB URINE DIPSTICK: NEGATIVE
KETONES UR: NEGATIVE mg/dL
LEUKOCYTES UA: NEGATIVE
Nitrite: NEGATIVE
PROTEIN: NEGATIVE mg/dL
Specific Gravity, Urine: 1.02 (ref 1.005–1.030)
pH: 6 (ref 5.0–8.0)

## 2018-02-22 LAB — CBC WITH DIFFERENTIAL/PLATELET
Abs Immature Granulocytes: 0 10*3/uL (ref 0.0–0.1)
BASOS ABS: 0.1 10*3/uL (ref 0.0–0.1)
BASOS PCT: 1 %
EOS PCT: 3 %
Eosinophils Absolute: 0.2 10*3/uL (ref 0.0–0.7)
HCT: 45.1 % (ref 39.0–52.0)
Hemoglobin: 14.4 g/dL (ref 13.0–17.0)
Immature Granulocytes: 0 %
Lymphocytes Relative: 45 %
Lymphs Abs: 3.2 10*3/uL (ref 0.7–4.0)
MCH: 29.3 pg (ref 26.0–34.0)
MCHC: 31.9 g/dL (ref 30.0–36.0)
MCV: 91.7 fL (ref 78.0–100.0)
MONO ABS: 0.7 10*3/uL (ref 0.1–1.0)
Monocytes Relative: 10 %
Neutro Abs: 2.9 10*3/uL (ref 1.7–7.7)
Neutrophils Relative %: 41 %
PLATELETS: 276 10*3/uL (ref 150–400)
RBC: 4.92 MIL/uL (ref 4.22–5.81)
RDW: 13.5 % (ref 11.5–15.5)
WBC: 7.1 10*3/uL (ref 4.0–10.5)

## 2018-02-22 LAB — LIPASE, BLOOD: LIPASE: 26 U/L (ref 11–51)

## 2018-02-22 MED ORDER — NAPROXEN 500 MG PO TABS: 500.0000 mg | ORAL_TABLET | Freq: Two times a day (BID) | ORAL | 0 refills | Status: AC

## 2018-02-22 MED ORDER — SODIUM CHLORIDE 0.9 % IV BOLUS
1000.0000 mL | Freq: Once | INTRAVENOUS | Status: AC
Start: 2018-02-22 — End: 2018-02-22
  Administered 2018-02-22: 1000 mL via INTRAVENOUS

## 2018-02-22 MED ORDER — METHOCARBAMOL 500 MG PO TABS: 500.0000 mg | ORAL_TABLET | Freq: Two times a day (BID) | ORAL | 0 refills | Status: AC

## 2018-02-22 MED ORDER — KETOROLAC TROMETHAMINE 30 MG/ML IJ SOLN
30.0000 mg | Freq: Once | INTRAMUSCULAR | Status: AC
  Administered 2018-02-22: 30 mg via INTRAVENOUS
  Filled 2018-02-22: qty 1

## 2018-02-22 NOTE — ED Triage Notes (Signed)
Patient complains of bilateral flank pain, worse on right since Wednesday. Denies hematuria, diarrhea. Patient alert, oriented, and in no apparent distress at this time.

## 2018-02-22 NOTE — Discharge Instructions (Signed)
Return to ED for worsening symptoms, trouble walking, numbness in your arms or legs, injuries or falls, chest pain or shortness of breath. Use Salonpas patches to help with pain.

## 2018-02-22 NOTE — ED Provider Notes (Signed)
[facility] EMERGENCY DEPARTMENT Provider Note   CSN: [id number] Arrival date & time: 02/22/18  1038     History   Chief Complaint Chief Complaint  Patient presents with  . Flank Pain    HPI Eric Friedman is a 42 y.o. male who presents to ED for evaluation of 2-day history of bilateral flank pain R > L.  States that the pain began suddenly on Wednesday morning.  Reports associated stiffness in his back.  The pain radiates to his epigastric area.  He has tried Flexeril with improvement in his symptoms.  However, he states that it is preventing him from completing his daily activities.  Cannot recall any inciting event that may have triggered the pain.  No prior history of similar symptoms.  Denies any urinary symptoms, fever, injuries or falls, numbness in arms or legs, loss of bowel or bladder function, vomiting, bowel changes.  HPI  History reviewed. No pertinent past medical history.  There are no active problems to display for this patient.   History reviewed. No pertinent surgical history.      Home Medications    Prior to Admission medications   Medication Sig Start Date End Date Taking? Authorizing Provider  methocarbamol (ROBAXIN) 500 MG tablet Take 1 tablet (500 mg total) by mouth 2 (two) times daily. 02/22/18   Donovin Kraemer, PA-C  naproxen (NAPROSYN) 500 MG tablet Take 1 tablet (500 mg total) by mouth 2 (two) times daily. 02/22/18   Dietrich Pates, PA-C    Family History No family history on file.  Social History Social History   Tobacco Use  . Smoking status: Current Every Day Smoker    Packs/day: 0.50  . Smokeless tobacco: Never Used  Substance Use Topics  . Alcohol use: No  . Drug use: No     Allergies   Patient has no known allergies.   Review of Systems Review of Systems  Constitutional: Negative for appetite change, chills and fever.  HENT: Negative for ear pain, rhinorrhea, sneezing and sore throat.   Eyes: Negative for  photophobia and visual disturbance.  Respiratory: Negative for cough, chest tightness, shortness of breath and wheezing.   Cardiovascular: Negative for chest pain and palpitations.  Gastrointestinal: Negative for abdominal pain, blood in stool, constipation, diarrhea, nausea and vomiting.  Genitourinary: Positive for flank pain. Negative for dysuria, hematuria and urgency.  Musculoskeletal: Negative for myalgias.  Skin: Negative for rash.  Neurological: Negative for dizziness, weakness and light-headedness.     Physical Exam Updated Vital Signs BP 118/76   Pulse (!) 57   Temp 98.8 F (37.1 C) (Oral)   Resp 16   SpO2 99%   Physical Exam  Constitutional: He appears well-developed and well-nourished. No distress.  HENT:  Head: Normocephalic and atraumatic.  Nose: Nose normal.  Eyes: Conjunctivae and EOM are normal. Right eye exhibits no discharge. Left eye exhibits no discharge. No scleral icterus.  Neck: Normal range of motion. Neck supple.  Cardiovascular: Normal rate, regular rhythm, normal heart sounds and intact distal pulses. Exam reveals no gallop and no friction rub.  No murmur heard. Pulmonary/Chest: Effort normal and breath sounds normal. No respiratory distress.  Abdominal: Soft. Bowel sounds are normal. He exhibits no distension. There is tenderness (Epigastric). There is no guarding.  Musculoskeletal: Normal range of motion. He exhibits no edema.       Back:  No midline spinal tenderness present in lumbar, thoracic or cervical spine. No step-off palpated. No visible bruising, edema  or temperature change noted. No objective signs of numbness present. No saddle anesthesia. 2+ DP pulses bilaterally. Sensation intact to light touch. Strength 5/5 in bilateral lower extremities.  Neurological: He is alert. He exhibits normal muscle tone. Coordination normal.  Skin: Skin is warm and dry. No rash noted.  Psychiatric: He has a normal mood and affect.  Nursing note and vitals  reviewed.    ED Treatments / Results  Labs (all labs ordered are listed, but only abnormal results are displayed) Labs Reviewed  URINALYSIS, ROUTINE W REFLEX MICROSCOPIC  CBC WITH DIFFERENTIAL/PLATELET  COMPREHENSIVE METABOLIC PANEL  LIPASE, BLOOD    EKG None  Radiology Ct Renal Stone Study  Result Date: 02/22/2018 CLINICAL DATA:  Bilateral flank pain, right greater than left. EXAM: CT ABDOMEN AND PELVIS WITHOUT CONTRAST TECHNIQUE: Multidetector CT imaging of the abdomen and pelvis was performed following the standard protocol without IV contrast. COMPARISON:  None. FINDINGS: Lower chest: Negative. Hepatobiliary: No focal liver abnormality is seen. No gallstones, gallbladder wall thickening, or biliary dilatation. Pancreas: Unremarkable. No pancreatic ductal dilatation or surrounding inflammatory changes. Spleen: Normal in size without focal abnormality. Adrenals/Urinary Tract: 6 mm low-density area in the lower pole of the right kidney, too small to characterized on this unenhanced scan. Statistically probably represents a small cyst. The kidneys otherwise appear normal. Ureters and bladder appear normal. Stomach/Bowel: Stomach and small bowel appear normal including the terminal ileum. Appendix is normal. Several diverticula in the ascending colon. No evidence of diverticulitis. Vascular/Lymphatic: No significant vascular findings are present. No enlarged abdominal or pelvic lymph nodes. Reproductive: Prostate is unremarkable. Other: No abdominal wall hernia or abnormality. No abdominopelvic ascites. Musculoskeletal: No acute or significant osseous findings. IMPRESSION: Benign-appearing abdomen and pelvis. Specifically, no evidence of renal or ureteral or bladder calculi. Minimal diverticulosis of the ascending colon. Electronically Signed   By: Francene Boyers M.D.   On: 02/22/2018 12:55    Procedures Procedures (including critical care time)  Medications Ordered in ED Medications    ketorolac (TORADOL) 30 MG/ML injection 30 mg (30 mg Intravenous Given 02/22/18 1345)  sodium chloride 0.9 % bolus 1,000 mL (1,000 mLs Intravenous New Bag/Given 02/22/18 1345)     Initial Impression / Assessment and Plan / ED Course  I have reviewed the triage vital signs and the nursing notes.  Pertinent labs & imaging results that were available during my care of the patient were reviewed by me and considered in my medical decision making (see chart for details).     42 year old male presents to ED for evaluation of bilateral flank pain for the past 2 days.  States that right greater than left.  Reports associated stiffness in the back.  He has some epigastric abdominal tenderness to palpation as well.  No neuro deficits on exam.  Ambulatory with normal gait.  He is afebrile.  Denies any urinary symptoms, bowel changes or vomiting.  Lab work including CBC, CMP, lipase, urinalysis all unremarkable.  CT renal stone study is negative.  Patient given Toradol and fluid bolus with significant improvement in his symptoms.  Vital signs remained stable. I informed patient about his negative workup and reassuring imaging. He asked me if I tested him for HIV.  I told patient that he never mentioned any type of STD symptoms or concern for STD at the beginning of his work-up.  I asked him if he would like to be tested for all STDs including HIV, syphilis and GC and chlamydia.  He states that "I thought  that's what you were testing for. I have unprotected intercourse and I have a spouse at home." I asked him if he would like to tested for STDs but he states "never mind, I'll just come back later."  Nevertheless, I doubt cauda equina, dissection or other surgical or emergent cause of symptoms.  Advised to return to ED for any severe worsening symptoms and to follow-up with PCP for further evaluation.  Portions of this note were generated with Scientist, clinical (histocompatibility and immunogenetics)Dragon dictation software. Dictation errors may occur despite best  attempts at proofreading.   Final Clinical Impressions(s) / ED Diagnoses   Final diagnoses:  Strain of mid-back, initial encounter    ED Discharge Orders        Ordered    methocarbamol (ROBAXIN) 500 MG tablet  2 times daily     02/22/18 1450    naproxen (NAPROSYN) 500 MG tablet  2 times daily     02/22/18 1450       Dietrich PatesKhatri, Marlissa Emerick, PA-C 02/22/18 1450    Shaune PollackIsaacs, Cameron, MD 02/25/18 [phone]

## 2018-02-22 NOTE — ED Notes (Signed)
Patient verbalizes understanding of discharge instructions. Opportunity for questioning and answers were provided. Armband removed by staff, pt discharged from ED.  

## 2018-02-22 NOTE — ED Notes (Signed)
The pt did not want to be seen in a hallway bed. The pt was told we will try to place him in the next available room.

## 2019-07-07 ENCOUNTER — Emergency Department (HOSPITAL_COMMUNITY)
Admission: EM | Admit: 2019-07-07 | Discharge: 2019-07-07 | Disposition: A | Discharge status: Home / Self Care | Payer: Medicaid Other | Attending: Emergency Medicine | Admitting: Emergency Medicine

## 2019-07-07 ENCOUNTER — Other Ambulatory Visit: Payer: Self-pay

## 2019-07-07 ENCOUNTER — Encounter (HOSPITAL_COMMUNITY): Payer: Self-pay

## 2019-07-07 DIAGNOSIS — Y9389 Activity, other specified: Secondary | ICD-10-CM | POA: Diagnosis not present

## 2019-07-07 DIAGNOSIS — Y999 Unspecified external cause status: Secondary | ICD-10-CM | POA: Insufficient documentation

## 2019-07-07 DIAGNOSIS — L089 Local infection of the skin and subcutaneous tissue, unspecified: Secondary | ICD-10-CM

## 2019-07-07 DIAGNOSIS — S81802A Unspecified open wound, left lower leg, initial encounter: Secondary | ICD-10-CM | POA: Diagnosis present

## 2019-07-07 DIAGNOSIS — F17210 Nicotine dependence, cigarettes, uncomplicated: Secondary | ICD-10-CM | POA: Diagnosis not present

## 2019-07-07 DIAGNOSIS — Y9289 Other specified places as the place of occurrence of the external cause: Secondary | ICD-10-CM | POA: Insufficient documentation

## 2019-07-07 DIAGNOSIS — Z79899 Other long term (current) drug therapy: Secondary | ICD-10-CM | POA: Insufficient documentation

## 2019-07-07 MED ORDER — CEPHALEXIN 500 MG PO CAPS: 500.0000 mg | ORAL_CAPSULE | Freq: Three times a day (TID) | ORAL | 0 refills | Status: AC

## 2019-07-07 MED ORDER — SULFAMETHOXAZOLE-TRIMETHOPRIM 800-160 MG PO TABS: 1.0000 | ORAL_TABLET | Freq: Two times a day (BID) | ORAL | 0 refills | Status: AC
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
## 2020-05-08 IMAGING — CT CT RENAL STONE PROTOCOL
2 of 4 series · 16 of 46 positions shown, 18 images · non-contrast
Comparison: None.

CLINICAL DATA: Bilateral flank pain, right greater than left.

EXAM:
CT ABDOMEN AND PELVIS WITHOUT CONTRAST
TECHNIQUE: Multidetector CT imaging of the abdomen and pelvis was performed
following the standard protocol without IV contrast.

[Series 3: ap without · axial · non-contrast · 0.73mm/px · z∈[+800,+1245]mm · 13 of 101 slices shown, 15 images]
[im 6/101  soft-tissue]
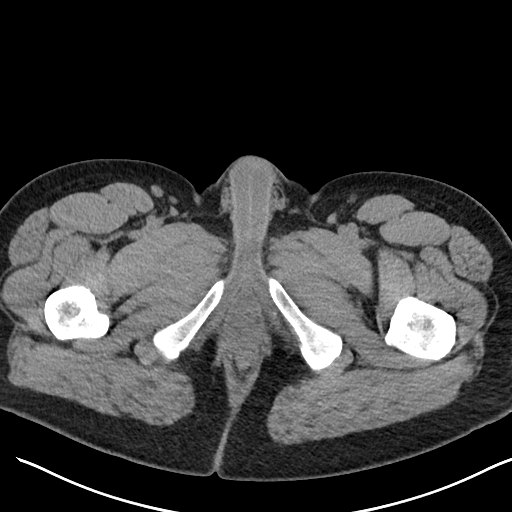
[im 6/101  bone]
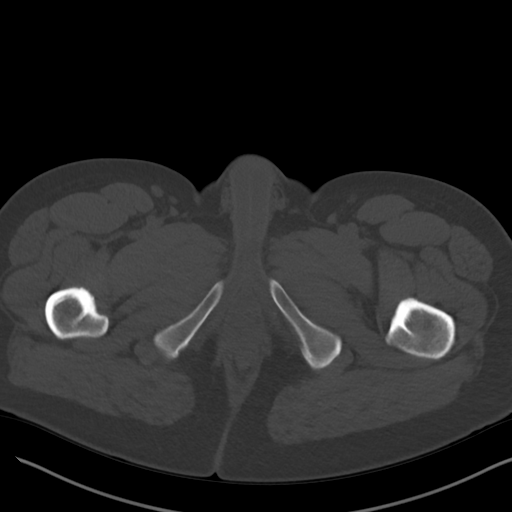
[im 12/101  soft-tissue]
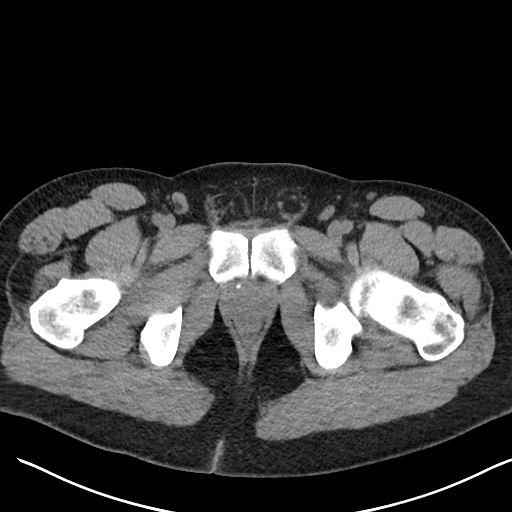
[im 24/101  soft-tissue]
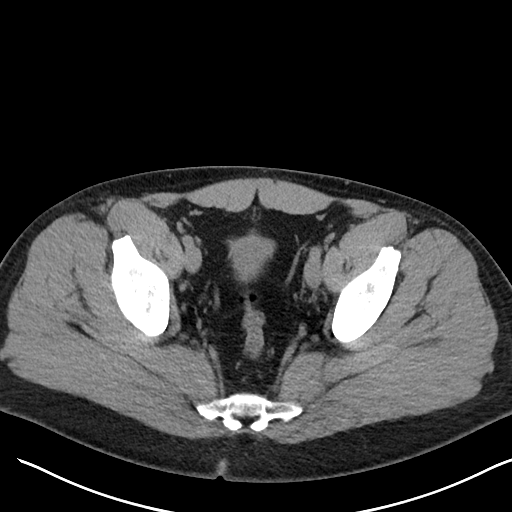
[im 30/101  soft-tissue]
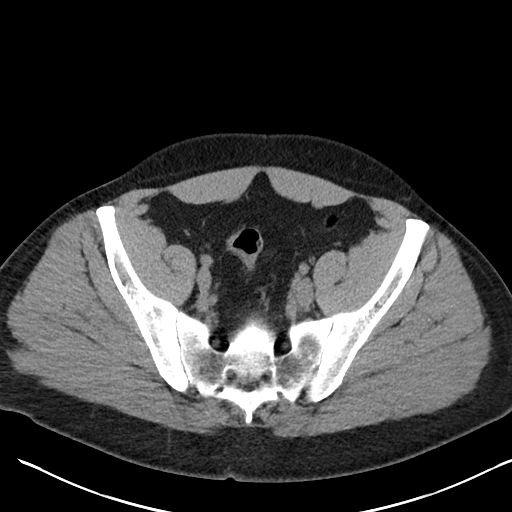
[im 36/101  soft-tissue]
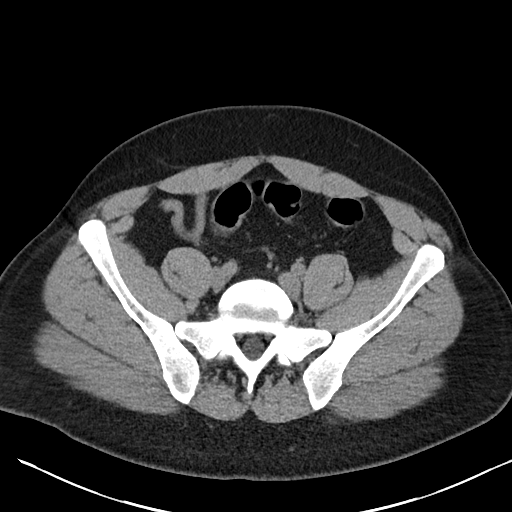
[im 42/101  soft-tissue]
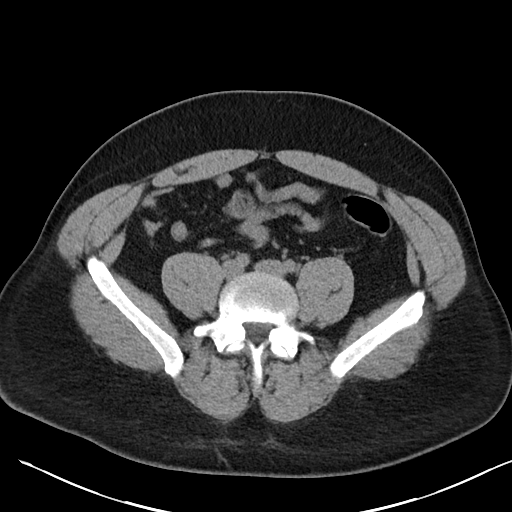
[im 53/101  soft-tissue]
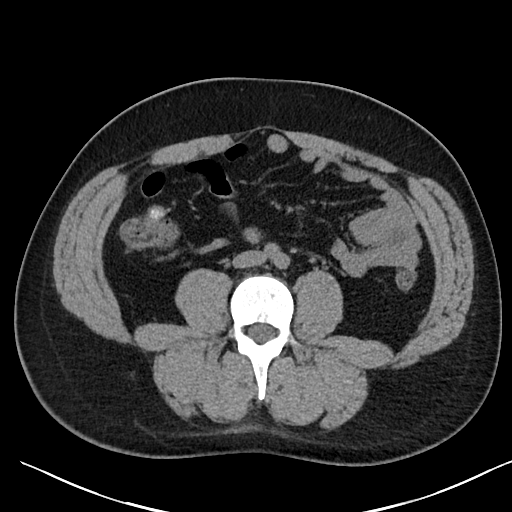
[im 59/101  soft-tissue]
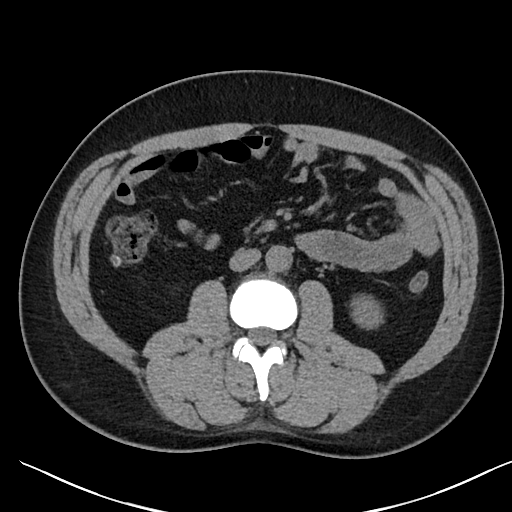
[im 65/101  soft-tissue]
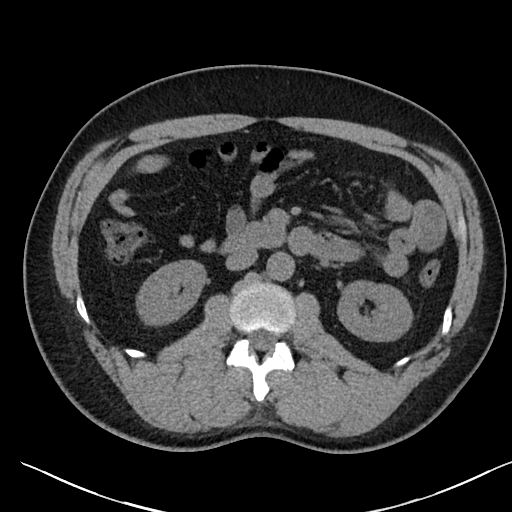
[im 65/101  bone]
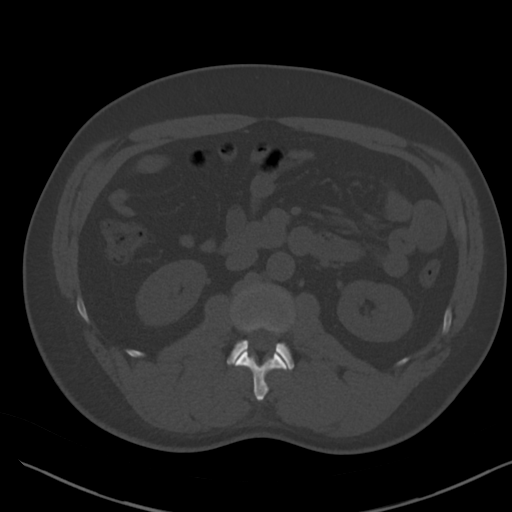
[im 71/101  soft-tissue]
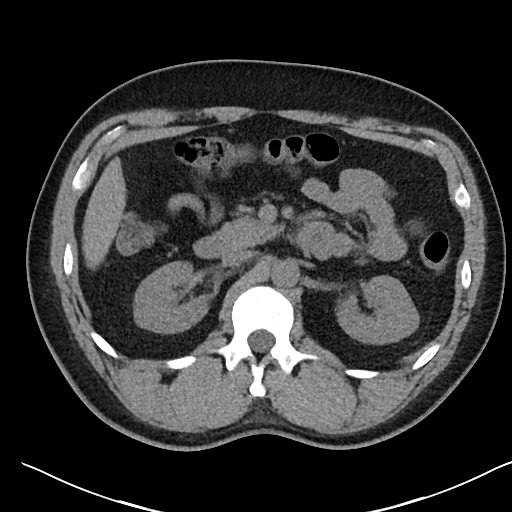
[im 77/101  soft-tissue]
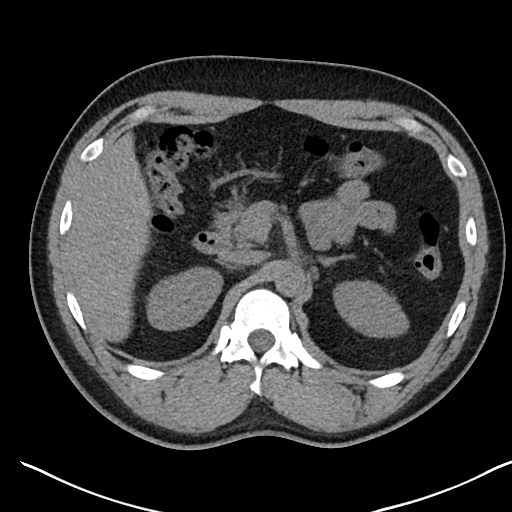
[im 89/101  soft-tissue]
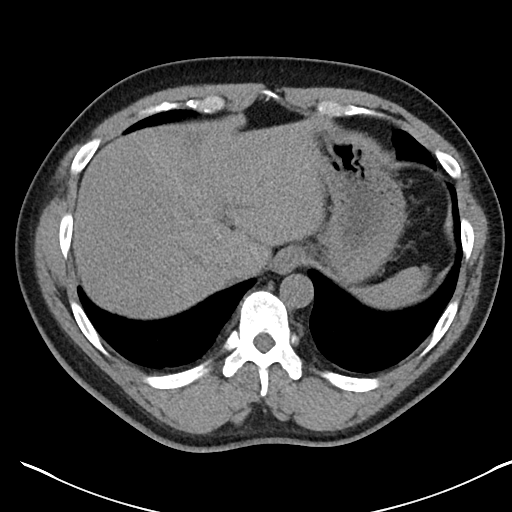
[im 95/101  soft-tissue]
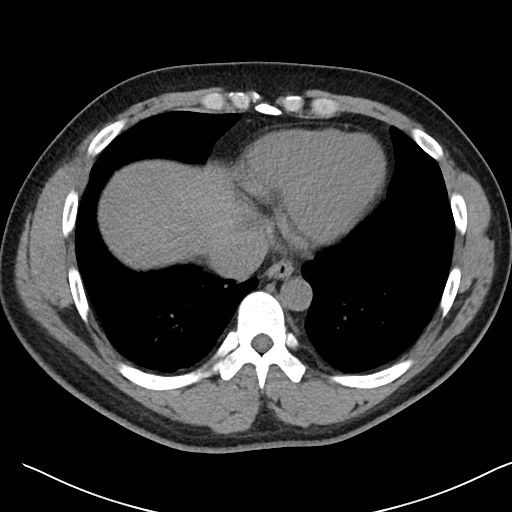

[Series 6: cor · coronal · 0.76mm/px · 3 of 101 slices shown]
[im 34/101  soft-tissue]
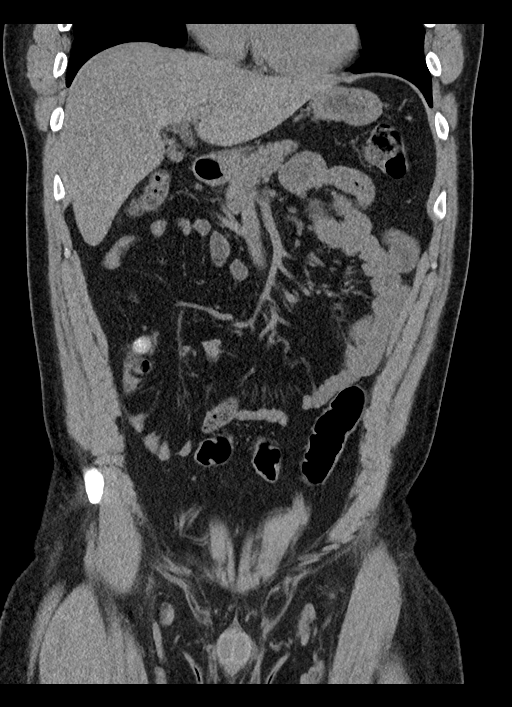
[im 45/101  soft-tissue]
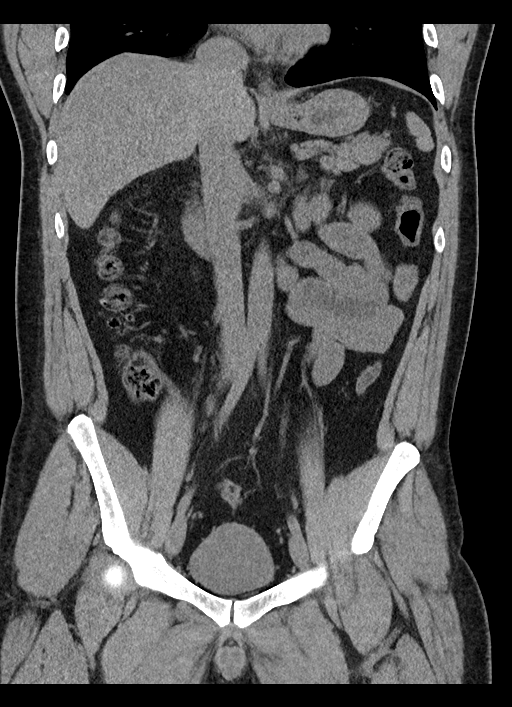
[im 56/101  soft-tissue]
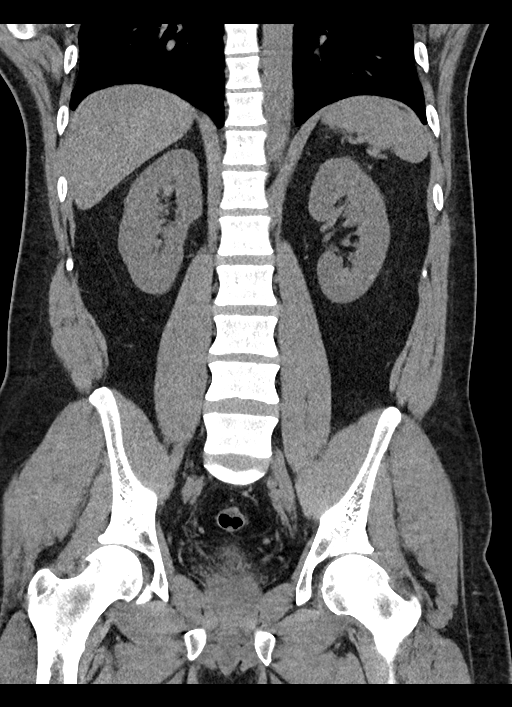

[16 of 46 positions shown; findings below may reference images not displayed]

FINDINGS: Lower chest: Negative.

Hepatobiliary: No focal liver abnormality is seen. No gallstones,
gallbladder wall thickening, or biliary dilatation.

Pancreas: Unremarkable. No pancreatic ductal dilatation or
surrounding inflammatory changes.

Spleen: Normal in size without focal abnormality.

Adrenals/Urinary Tract: 6 mm low-density area in the lower pole of
the right kidney, too small to characterized on this unenhanced
scan. Statistically probably represents a small cyst. The kidneys
otherwise appear normal. Ureters and bladder appear normal.

Stomach/Bowel: Stomach and small bowel appear normal including the
terminal ileum. Appendix is normal. Several diverticula in the
ascending colon. No evidence of diverticulitis.

Vascular/Lymphatic: No significant vascular findings are present. No
enlarged abdominal or pelvic lymph nodes.

Reproductive: Prostate is unremarkable.

Other: No abdominal wall hernia or abnormality. No abdominopelvic
ascites.

Musculoskeletal: No acute or significant osseous findings.
IMPRESSION: Benign-appearing abdomen and pelvis. Specifically, no evidence of
renal or ureteral or bladder calculi. Minimal diverticulosis of the
ascending colon.
